# Patient Record
Sex: Female | Born: 1985 | Race: White | Hispanic: No | Marital: Single | Smoking: Current every day smoker
Health system: Southern US, Community
[De-identification: ages and names within clinical notes are randomized; demographics above are authoritative.]

## PROBLEM LIST (undated history)

## (undated) HISTORY — PX: FOOT SURGERY: SHX648

---
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
## 2006-03-23 ENCOUNTER — Emergency Department: Payer: Self-pay | Admitting: Emergency Medicine

## 2007-03-08 ENCOUNTER — Observation Stay: Payer: Self-pay

## 2007-03-10 ENCOUNTER — Inpatient Hospital Stay: Payer: Self-pay | Admitting: Obstetrics and Gynecology

## 2007-05-25 ENCOUNTER — Emergency Department: Payer: Self-pay | Admitting: Emergency Medicine

## 2009-01-29 IMAGING — CR DG FOOT COMPLETE 3+V*L*
1 series · 3 of 3 positions shown · non-contrast
Comparison: none

REASON FOR EXAM: inj   mc-3
COMMENTS:   LMP: Post Partum

PROCEDURE:     DXR - DXR FOOT LT COMP W/OBLIQUES  - May 25, 2007  [DATE]
RESULT:     There is a minimally nondisplaced transverse fracture of the
proximal portion of the proximal phalanx of the 5th toe. No other fractures
are seen.

[Series 1: view not recorded · 0.17mm/px · 3 of 3 slices shown]
[im 1/3]
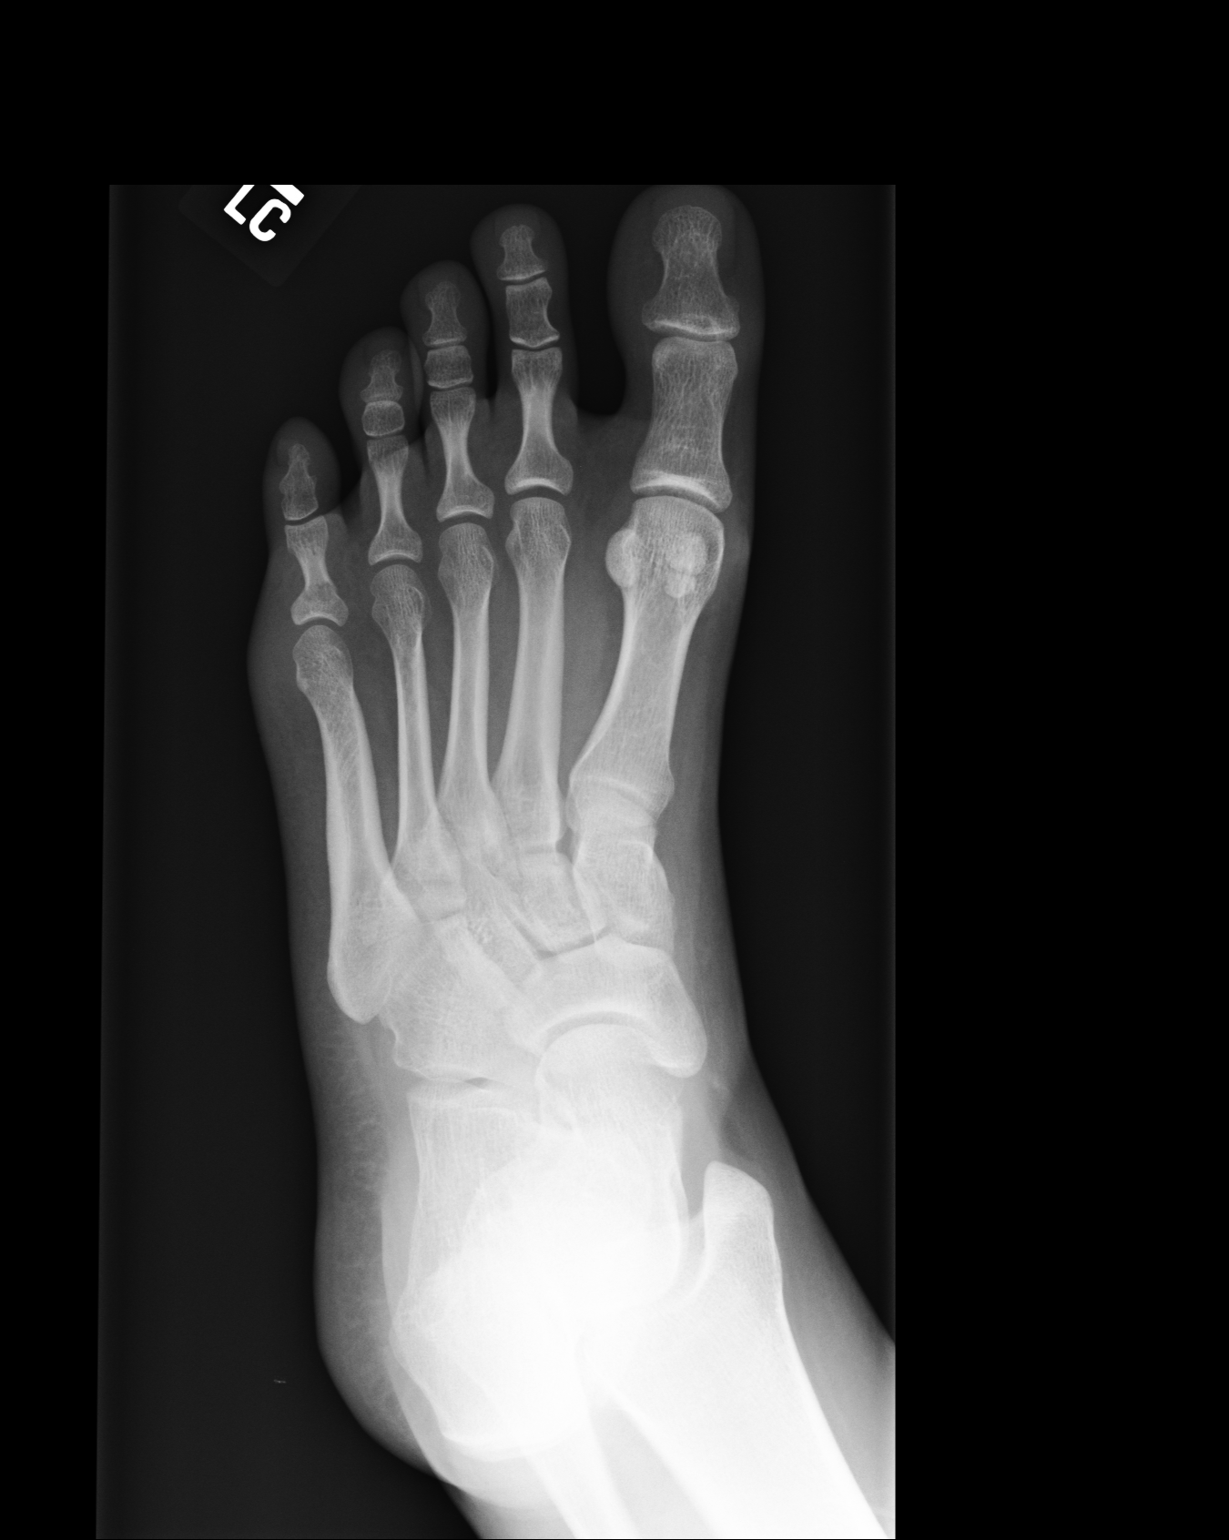
[im 2/3]
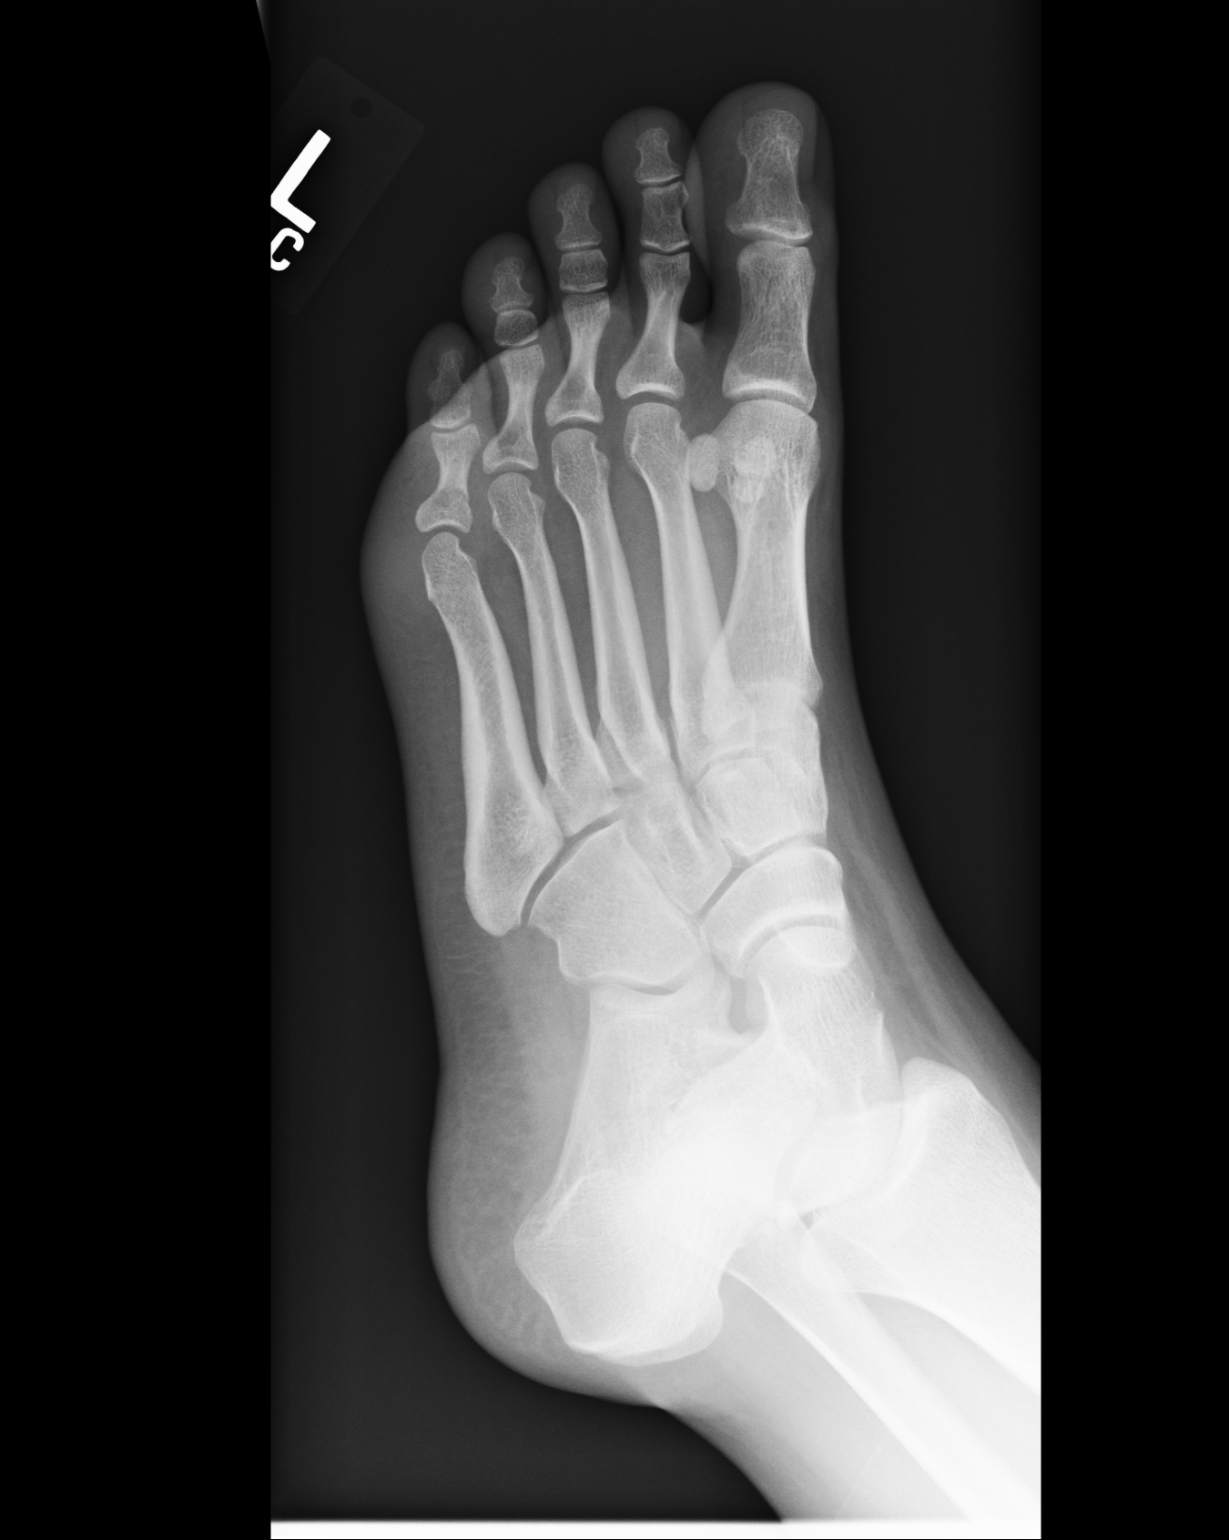
[im 3/3]
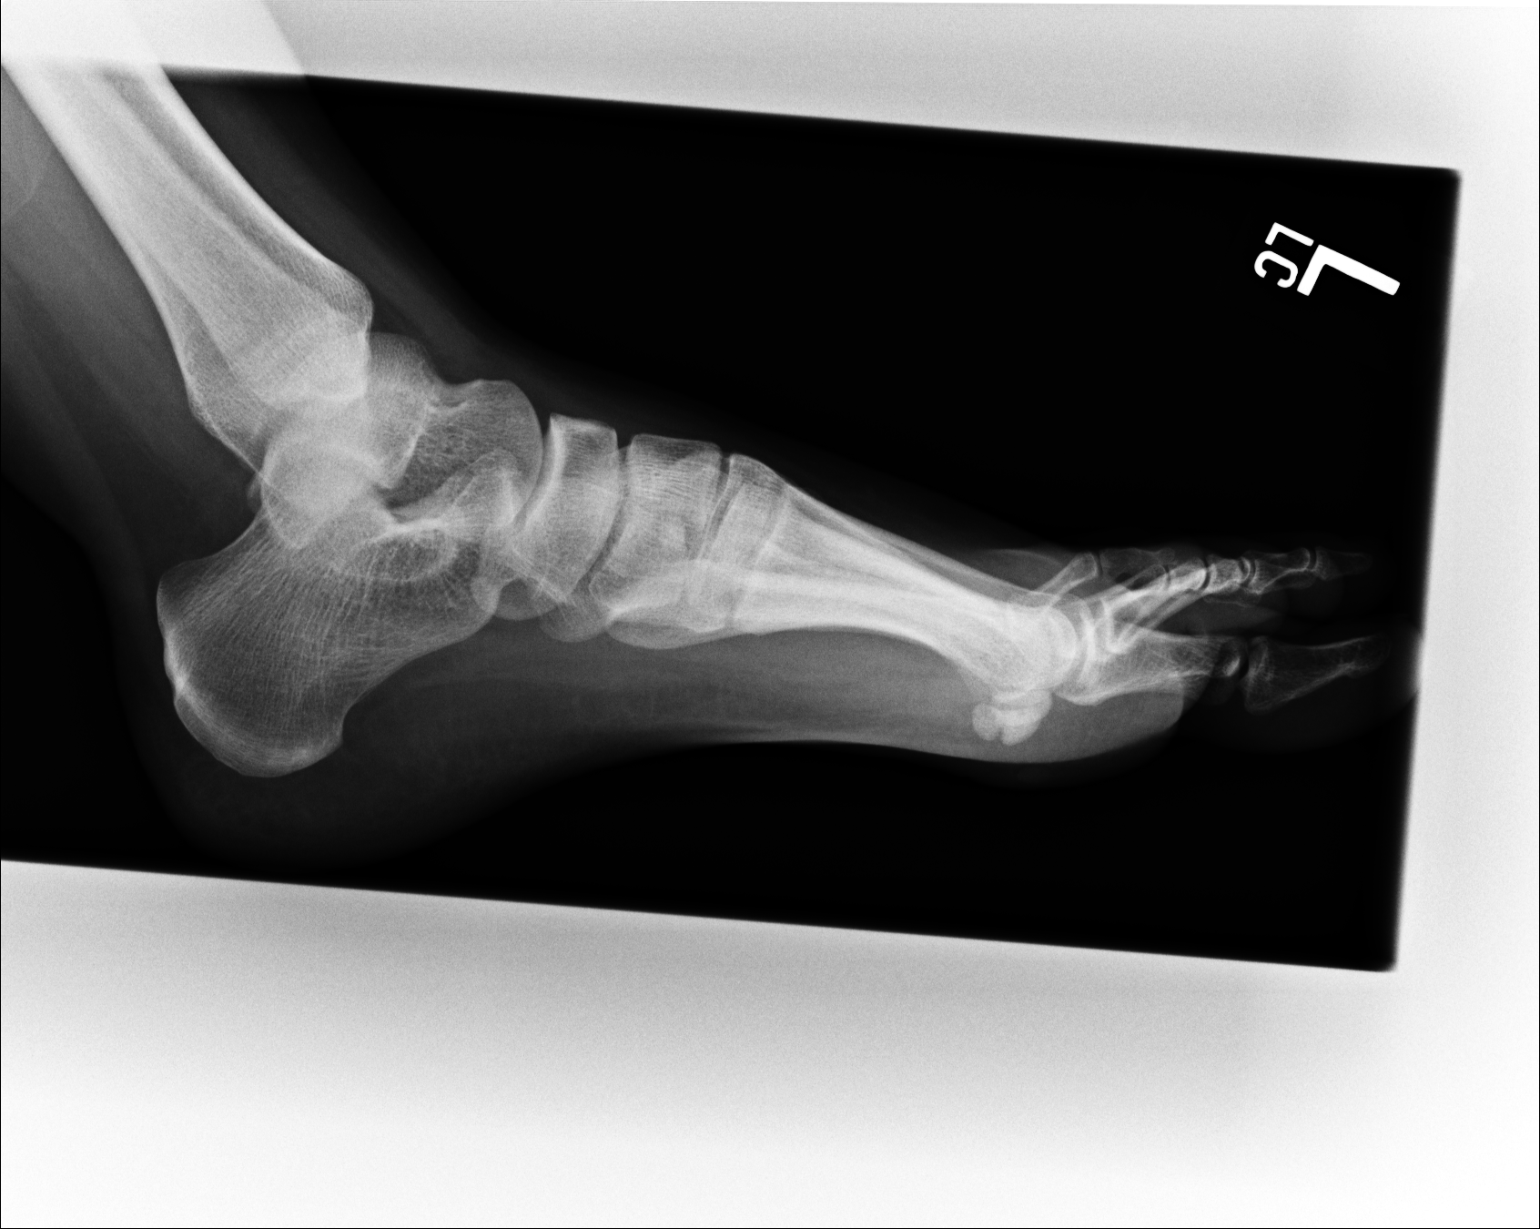

[3 of 3 positions shown; findings below may reference images not displayed]

IMPRESSION: Fracture of the proximal phalanx of the fifth toe.

## 2012-07-28 ENCOUNTER — Emergency Department: Payer: Self-pay | Admitting: Emergency Medicine

## 2013-07-08 ENCOUNTER — Ambulatory Visit: Payer: Self-pay

## 2013-07-08 LAB — RAPID STREP-A WITH REFLX: Micro Text Report: POSITIVE

## 2013-12-17 ENCOUNTER — Emergency Department: Payer: Self-pay | Admitting: Emergency Medicine

## 2013-12-17 LAB — URINALYSIS, COMPLETE
BACTERIA: NONE SEEN
Bilirubin,UR: NEGATIVE
GLUCOSE, UR: NEGATIVE mg/dL (ref 0–75)
Ketone: NEGATIVE
Leukocyte Esterase: NEGATIVE
Nitrite: NEGATIVE
PROTEIN: NEGATIVE
Ph: 6 (ref 4.5–8.0)
SPECIFIC GRAVITY: 1.023 (ref 1.003–1.030)
Squamous Epithelial: <1
WBC UR: <1 /HPF (ref 0–5)

## 2016-06-19 ENCOUNTER — Encounter: Payer: Self-pay | Admitting: Emergency Medicine

## 2016-06-19 DIAGNOSIS — L0501 Pilonidal cyst with abscess: Secondary | ICD-10-CM | POA: Insufficient documentation

## 2016-06-19 DIAGNOSIS — F17210 Nicotine dependence, cigarettes, uncomplicated: Secondary | ICD-10-CM | POA: Diagnosis not present

## 2016-06-19 NOTE — ED Triage Notes (Signed)
Patient ambulatory to triage with steady gait, without difficulty or distress noted; pt reports since yesterday has noticed abscess to coccyx; denies hx of same

## 2016-06-20 ENCOUNTER — Emergency Department
Admission: EM | Admit: 2016-06-20 | Discharge: 2016-06-20 | Disposition: A | Discharge status: Home / Self Care | Payer: Medicaid Other | Attending: Emergency Medicine | Admitting: Emergency Medicine

## 2016-06-20 DIAGNOSIS — L0501 Pilonidal cyst with abscess: Secondary | ICD-10-CM

## 2016-06-20 MED ORDER — BACITRACIN ZINC 500 UNIT/GM EX OINT
TOPICAL_OINTMENT | CUTANEOUS | Status: AC
  Filled 2016-06-20: qty 0.9

## 2016-06-20 MED ORDER — SULFAMETHOXAZOLE-TRIMETHOPRIM 800-160 MG PO TABS
ORAL_TABLET | ORAL | Status: AC
  Filled 2016-06-20: qty 1

## 2016-06-20 MED ORDER — OXYCODONE-ACETAMINOPHEN 5-325 MG PO TABS
ORAL_TABLET | ORAL | Status: AC
  Filled 2016-06-20: qty 1

## 2016-06-20 MED ORDER — LIDOCAINE HCL (PF) 1 % IJ SOLN
INTRAMUSCULAR | Status: AC
  Filled 2016-06-20: qty 5

## 2016-06-20 MED ORDER — OXYCODONE-ACETAMINOPHEN 5-325 MG PO TABS: 1.0000 | ORAL_TABLET | ORAL | 0 refills | Status: DC | PRN

## 2016-06-20 MED ORDER — SULFAMETHOXAZOLE-TRIMETHOPRIM 800-160 MG PO TABS: 1.0000 | ORAL_TABLET | Freq: Two times a day (BID) | ORAL | 0 refills | Status: AC

## 2016-06-20 NOTE — ED Provider Notes (Signed)
[facility] Emergency Department Provider Note    First MD Initiated Contact with Patient 06/20/16 0124     (approximate)  I have reviewed the triage vital signs and the nursing notes.   HISTORY  Chief Complaint Abscess    HPI Sara Middleton is a 31 y.o. female presents with a one-day history of abscess superior to her buttocks. Patient states current pain score is 10 out of 10 located at the abscess site. Patient denies any fever afebrile on presentation. Patient denies any drainage from the abscess site. Patient denies any previous history of abscess   Past medical history None There are no active problems to display for this patient.   Past Surgical History:  Procedure Laterality Date  . FOOT SURGERY Right     Prior to Admission medications   Not on File    Allergies Codeine  Family history Noncontributory   Social History Social History  Substance Use Topics  . Smoking status: Current Every Day Smoker    Packs/day: 0.50    Types: Cigarettes  . Smokeless tobacco: Never Used  . Alcohol use Not on file    Review of Systems Constitutional: No fever/chills Eyes: No visual changes. ENT: No sore throat. Cardiovascular: Denies chest pain. Respiratory: Denies shortness of breath. Gastrointestinal: No abdominal pain.  No nausea, no vomiting.  No diarrhea.  No constipation. Genitourinary: Negative for dysuria. Musculoskeletal: Negative for back pain. Integumentary: Positive for buttock abscess Neurological: Negative for headaches, focal weakness or numbness.   ____________________________________________   PHYSICAL EXAM:  VITAL SIGNS: ED Triage Vitals [06/19/16 2236]  Enc Vitals Group     BP 133/70     Pulse Rate (!) 119     Resp 18     Temp 99.6 F (37.6 C)     Temp Source Oral     SpO2 97 %     Weight 214 lb (97.1 kg)     Height 5\' 5"  (1.651 m)     Head Circumference      Peak Flow      Pain Score 10     Pain  Loc      Pain Edu?      Excl. in GC?     Constitutional: Alert and oriented. Well appearing and in no acute distress. Eyes: Conjunctivae are normal. PERRL. EOMI. Head: Atraumatic. Mouth/Throat: Mucous membranes are moist. Neck: No stridor.  Cardiovascular: Normal rate, regular rhythm. Good peripheral circulation. Grossly normal heart sounds. Respiratory: Normal respiratory effort.  No retractions. Lungs CTAB. Gastrointestinal: Soft and nontender. No distention.  Musculoskeletal: No lower extremity tenderness nor edema. No gross deformities of extremities. Neurologic:  Normal speech and language. No gross focal neurologic deficits are appreciated.  Skin:  Pustule noted with surrounding erythema superior to the median gluteal fall Psychiatric: Mood and affect are normal. Speech and behavior are normal.    ..Incision and Drainage Date/Time: 06/20/2016 5:33 AM Performed by: Darci CurrentBROWN, [place] N Authorized by: Darci CurrentBROWN, [place] N   Consent:    Consent obtained:  Verbal   Consent given by:  Patient   Risks discussed:  Bleeding, incomplete drainage and pain   Alternatives discussed:  No treatment Location:    Type:  Abscess   Location:  Anogenital   Anogenital location:  Pilonidal Pre-procedure details:    Skin preparation:  Betadine Anesthesia (see MAR for exact dosages):    Anesthesia method:  Local infiltration   Local anesthetic:  Lidocaine 1% w/o epi Procedure type:  Complexity:  Simple Procedure details:    Needle aspiration: no     Incision types:  Cruciate   Incision depth:  Dermal   Scalpel blade:  11   Drainage:  Bloody and purulent   Drainage amount:  Moderate   Wound treatment:  Wound left open   Packing materials:  None Post-procedure details:    Patient tolerance of procedure:  Tolerated well, no immediate complications     ____________________________________________   INITIAL IMPRESSION / ASSESSMENT AND PLAN / ED COURSE  Pertinent labs & imaging  results that were available during my care of the patient were reviewed by me and considered in my medical decision making (see chart for details).  Patient given Bactrim DS and Percocet emergency department will be prescribed the same for home's. Spoke with the patient at length regarding warning signs that would warm return to the emergency department including worsening pain erythema and fever.      ____________________________________________  FINAL CLINICAL IMPRESSION(S) / ED DIAGNOSES  Final diagnoses:  Pilonidal abscess     MEDICATIONS GIVEN DURING THIS VISIT:  Medications  lidocaine (PF) (XYLOCAINE) 1 % injection (not administered)  sulfamethoxazole-trimethoprim (BACTRIM DS,SEPTRA DS) 800-160 MG per tablet (not administered)  oxyCODONE-acetaminophen (PERCOCET/ROXICET) 5-325 MG per tablet (not administered)     NEW OUTPATIENT MEDICATIONS STARTED DURING THIS VISIT:  New Prescriptions   No medications on file    Modified Medications   No medications on file    Discontinued Medications   No medications on file     Note:  This document was prepared using Dragon voice recognition software and may include unintentional dictation errors.    Darci Current, MD 06/20/16 [phone]

## 2016-06-20 NOTE — ED Notes (Signed)
MD Manson PasseyBrown and this RN at bedside.

## 2016-06-20 NOTE — ED Notes (Signed)

## 2017-08-04 ENCOUNTER — Emergency Department: Payer: Self-pay

## 2017-08-04 ENCOUNTER — Emergency Department
Admission: EM | Admit: 2017-08-04 | Discharge: 2017-08-04 | Disposition: A | Discharge status: Home / Self Care | Payer: Self-pay | Attending: Student in an Organized Health Care Education/Training Program | Admitting: Student in an Organized Health Care Education/Training Program

## 2017-08-04 ENCOUNTER — Encounter: Payer: Self-pay | Admitting: Emergency Medicine

## 2017-08-04 DIAGNOSIS — S92512A Displaced fracture of proximal phalanx of left lesser toe(s), initial encounter for closed fracture: Secondary | ICD-10-CM | POA: Insufficient documentation

## 2017-08-04 DIAGNOSIS — Y929 Unspecified place or not applicable: Secondary | ICD-10-CM | POA: Insufficient documentation

## 2017-08-04 DIAGNOSIS — Y998 Other external cause status: Secondary | ICD-10-CM | POA: Insufficient documentation

## 2017-08-04 DIAGNOSIS — Y9301 Activity, walking, marching and hiking: Secondary | ICD-10-CM | POA: Insufficient documentation

## 2017-08-04 DIAGNOSIS — W01198A Fall on same level from slipping, tripping and stumbling with subsequent striking against other object, initial encounter: Secondary | ICD-10-CM | POA: Insufficient documentation

## 2017-08-04 DIAGNOSIS — F17210 Nicotine dependence, cigarettes, uncomplicated: Secondary | ICD-10-CM | POA: Insufficient documentation

## 2017-08-04 MED ORDER — HYDROCODONE-ACETAMINOPHEN 5-325 MG PO TABS
1.0000 | ORAL_TABLET | Freq: Once | ORAL | Status: AC
  Administered 2017-08-04: 1 via ORAL
  Filled 2017-08-04: qty 1

## 2017-08-04 MED ORDER — LIDOCAINE HCL (PF) 1 % IJ SOLN
INTRAMUSCULAR | Status: AC
  Filled 2017-08-04: qty 5

## 2017-08-04 MED ORDER — MELOXICAM 15 MG PO TABS: 15.0000 mg | ORAL_TABLET | Freq: Every day | ORAL | 1 refills | Status: AC

## 2017-08-04 MED ORDER — HYDROCODONE-ACETAMINOPHEN 5-325 MG PO TABS: 1.0000 | ORAL_TABLET | Freq: Four times a day (QID) | ORAL | 0 refills | Status: AC | PRN

## 2017-08-04 MED ORDER — LIDOCAINE HCL 1 % IJ SOLN
5.0000 mL | Freq: Once | INTRAMUSCULAR | Status: AC
  Administered 2017-08-04: 21:00:00
  Filled 2017-08-04: qty 5

## 2017-08-04 NOTE — ED Provider Notes (Signed)
[facility] Emergency Department Provider Note  ____________________________________________  Time seen: Approximately 9:09 PM  I have reviewed the triage vital signs and the nursing notes.   HISTORY  Chief Complaint Foot Injury    HPI Emeline GinsRebecca L Usman is a 32 y.o. female presents to the emergency department after patient tripped over her son and injured left fifth toe.  Patient reports that left fifth toe has been in an odd position since injury occurred.  Patient denies skin compromise.  No weakness or changes in sensation of the lower extremities.   History reviewed. No pertinent past medical history.  There are no active problems to display for this patient.   Past Surgical History:  Procedure Laterality Date  . FOOT SURGERY Right     Prior to Admission medications   Medication Sig Start Date End Date Taking? Authorizing Provider  HYDROcodone-acetaminophen (NORCO) 5-325 MG tablet Take 1 tablet by mouth every 6 (six) hours as needed for up to 3 days for moderate pain. 08/04/17 08/07/17  Orvil FeilWoods, Nobuo Nunziata M, PA-C  meloxicam (MOBIC) 15 MG tablet Take 1 tablet (15 mg total) by mouth daily for 7 days. 08/04/17 08/11/17  Orvil FeilWoods, Lael Pilch M, PA-C  oxyCODONE-acetaminophen (ROXICET) 5-325 MG tablet Take 1 tablet by mouth every 4 (four) hours as needed for severe pain. 06/20/16   Darci CurrentBrown, [place] N, MD    Allergies Codeine  No family history on file.  Social History Social History   Tobacco Use  . Smoking status: Current Every Day Smoker    Packs/day: 0.50    Types: Cigarettes  . Smokeless tobacco: Never Used  Substance Use Topics  . Alcohol use: Not on file  . Drug use: Not on file     Review of Systems  Constitutional: No fever/chills Eyes: No visual changes. No discharge ENT: No upper respiratory complaints. Cardiovascular: no chest pain. Respiratory: no cough. No SOB. Gastrointestinal: No abdominal pain.  No nausea, no vomiting.  No diarrhea.  No  constipation. Genitourinary: Negative for dysuria. No hematuria Musculoskeletal: Patient has  left fifth toe pain.  Skin: Negative for rash, abrasions, lacerations, ecchymosis. Neurological: Negative for headaches, focal weakness or numbness.   ____________________________________________   PHYSICAL EXAM:  VITAL SIGNS: ED Triage Vitals  Enc Vitals Group     BP 08/04/17 1918 130/66     Pulse Rate 08/04/17 1918 86     Resp 08/04/17 1918 17     Temp 08/04/17 1918 98.4 F (36.9 C)     Temp Source 08/04/17 1918 Oral     SpO2 08/04/17 1918 99 %     Weight 08/04/17 1842 250 lb (113.4 kg)     Height 08/04/17 1842 5\' 5"  (1.651 m)     Head Circumference --      Peak Flow --      Pain Score 08/04/17 1842 6     Pain Loc --      Pain Edu? --      Excl. in GC? --      Constitutional: Alert and oriented. Well appearing and in no acute distress. Eyes: Conjunctivae are normal. PERRL. EOMI. Head: Atraumatic. Cardiovascular: Normal rate, regular rhythm. Normal S1 and S2.  Good peripheral circulation. Respiratory: Normal respiratory effort without tachypnea or retractions. Lungs CTAB. Good air entry to the bases with no decreased or absent breath sounds. Gastrointestinal: Bowel sounds 4 quadrants. Soft and nontender to palpation. No guarding or rigidity. No palpable masses. No distention. No CVA tenderness. Musculoskeletal: Full range of  motion to all extremities.  Palpable dorsalis pedis pulse, left. Neurologic:  Normal speech and language. No gross focal neurologic deficits are appreciated.  Skin:  Skin is warm, dry and intact. No rash noted. Psychiatric: Mood and affect are normal. Speech and behavior are normal. Patient exhibits appropriate insight and judgement.   ____________________________________________   LABS (all labs ordered are listed, but only abnormal results are displayed)  Labs Reviewed - No data to  display ____________________________________________  EKG   ____________________________________________  RADIOLOGY I personally viewed and evaluated these images as part of my medical decision making, as well as reviewing the written report by the radiologist.  Dg Foot Complete Left  Result Date: 08/04/2017 CLINICAL DATA:  Left little toe injury. EXAM: LEFT FOOT - COMPLETE 3+ VIEW COMPARISON:  None. FINDINGS: There is a laterally angulated fracture of the proximal shaft of the left fifth proximal phalanx. No other fracture. IMPRESSION: Laterally angulated fracture of the proximal phalanx of the left fifth toe. Electronically Signed   By: Deatra Robinson M.D.   On: 08/04/2017 19:15    ____________________________________________    PROCEDURES  Procedure(s) performed:    Procedures   Patient's left fifth toe was anesthetized using lidocaine 1% without epinephrine.  Patient's toe was reduced using traction and medial deviation.  Medications  lidocaine (XYLOCAINE) 1 % (with pres) injection 5 mL (has no administration in time range)  HYDROcodone-acetaminophen (NORCO/VICODIN) 5-325 MG per tablet 1 tablet (1 tablet Oral Given 08/04/17 2109)     ____________________________________________   INITIAL IMPRESSION / ASSESSMENT AND PLAN / ED COURSE  Pertinent labs & imaging results that were available during my care of the patient were reviewed by me and considered in my medical decision making (see chart for details).  Review of the [place] CSRS was performed in accordance of the [facility] prior to dispensing any controlled drugs.      Assessment and plan Left fifth toe fracture Patient presents to the emergency department with left fifth toe pain after tripping over her son.  Patient's left fifth toe was reduced in the emergency department and buddy taped.  A postop shoe was given and patient was discharged with meloxicam and Norco.  Patient was advised to follow-up with podiatry.  Vital  signs are reassuring prior to discharge.     ____________________________________________  FINAL CLINICAL IMPRESSION(S) / ED DIAGNOSES  Final diagnoses:  Closed displaced fracture of proximal phalanx of lesser toe of left foot, initial encounter      NEW MEDICATIONS STARTED DURING THIS VISIT:  ED Discharge Orders        Ordered    HYDROcodone-acetaminophen (NORCO) 5-325 MG tablet  Every 6 hours PRN     08/04/17 2058    meloxicam (MOBIC) 15 MG tablet  Daily     08/04/17 2058          This chart was dictated using voice recognition software/Dragon. Despite best efforts to proofread, errors can occur which can change the meaning. Any change was purely unintentional.    Gasper Lloyd 08/04/17 2114    Willy Eddy, MD 08/04/17 2222

## 2017-08-04 NOTE — ED Triage Notes (Signed)
Tripped over son and injured left foot -- fifth toe.

## 2019-04-11 IMAGING — CR DG FOOT COMPLETE 3+V*L*
1 series · 3 of 3 positions shown · non-contrast
Comparison: None.

CLINICAL DATA: Left little toe injury.

EXAM:
LEFT FOOT - COMPLETE 3+ VIEW

[Series 1: x foot ap left · 0.14mm/px · 3 of 3 slices shown]
[im 1/3]
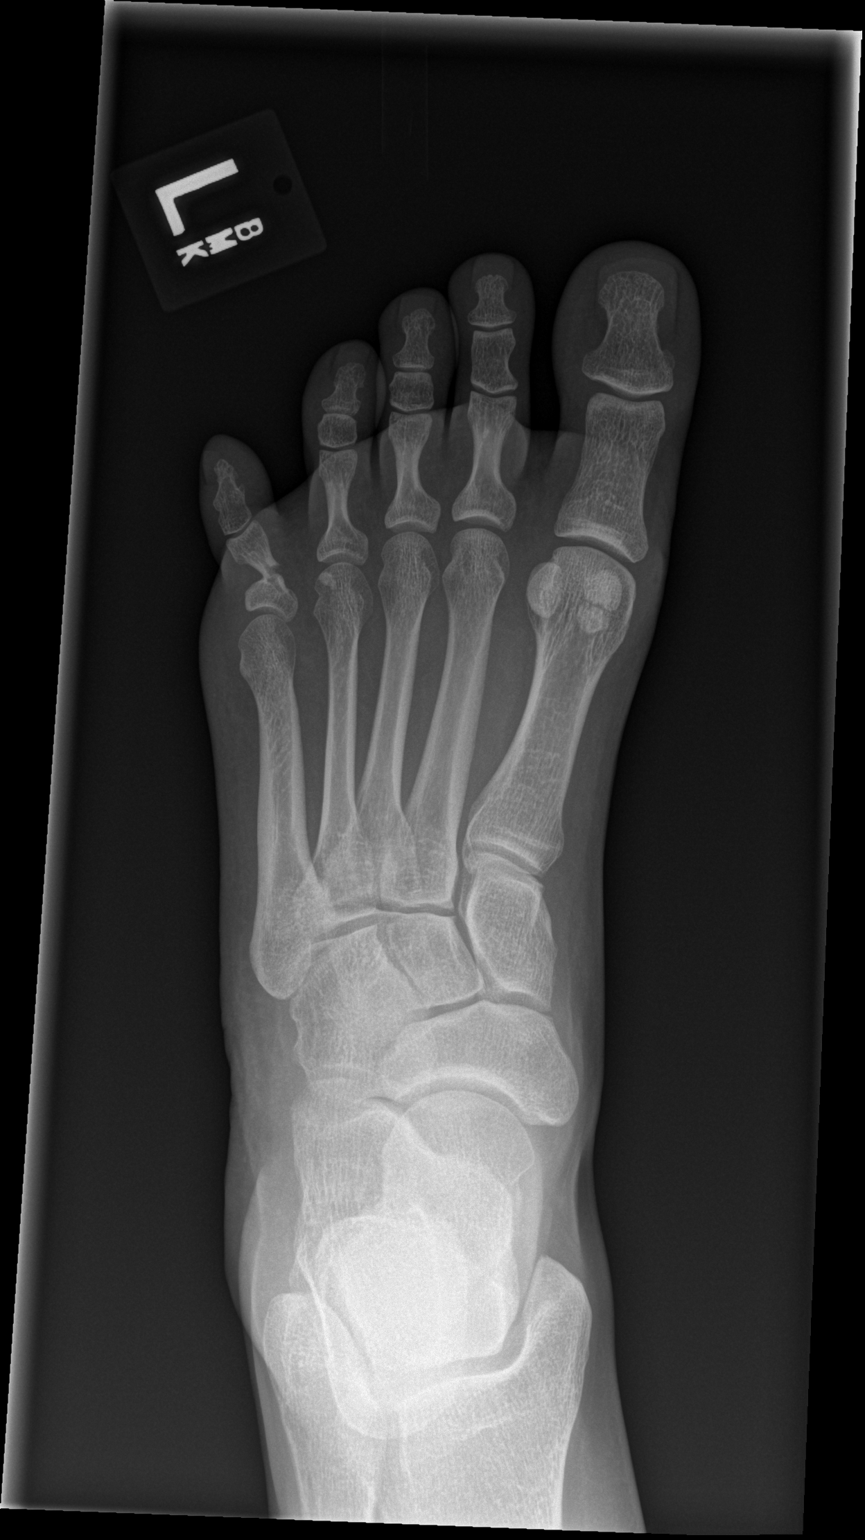
[im 2/3]
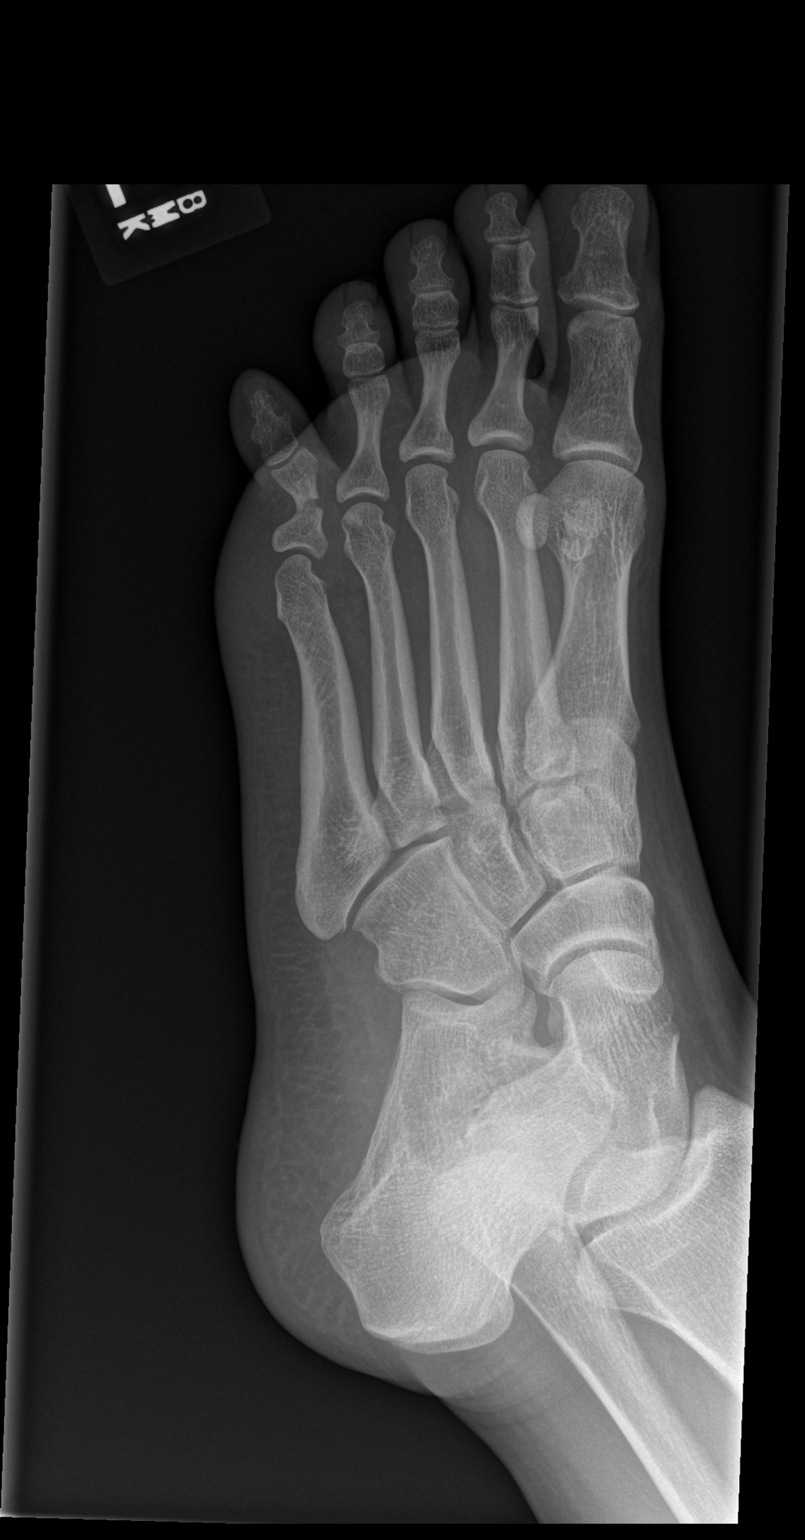
[im 3/3]
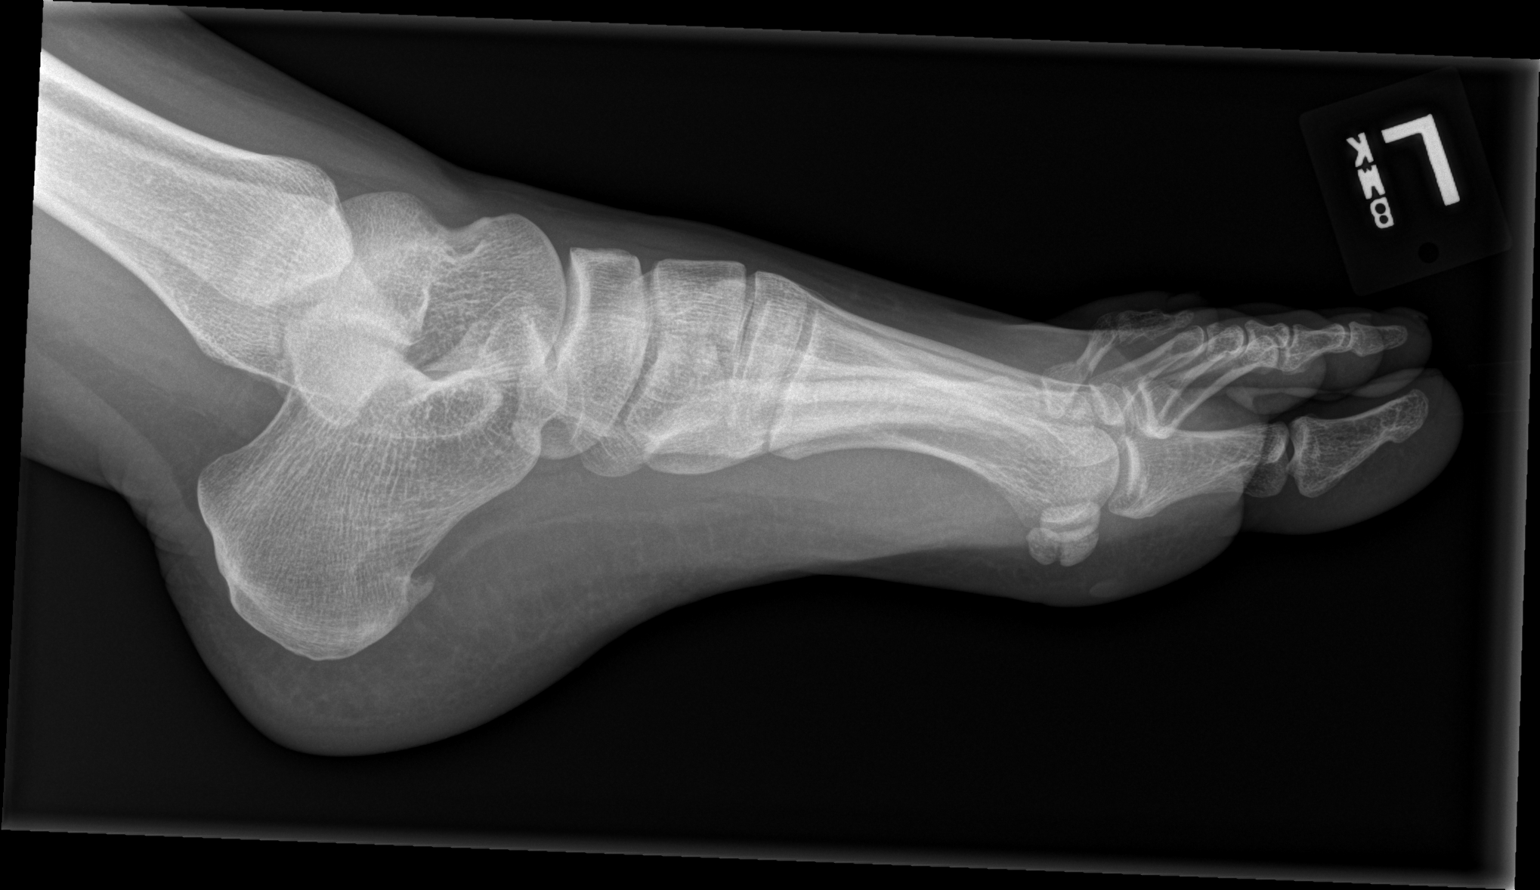

[3 of 3 positions shown; findings below may reference images not displayed]

FINDINGS: There is a laterally angulated fracture of the proximal shaft of the
left fifth proximal phalanx. No other fracture.
IMPRESSION: Laterally angulated fracture of the proximal phalanx of the left
fifth toe.

## 2019-11-16 ENCOUNTER — Encounter: Payer: Self-pay | Admitting: Emergency Medicine

## 2019-11-16 ENCOUNTER — Other Ambulatory Visit: Payer: Self-pay

## 2019-11-16 ENCOUNTER — Emergency Department
Admission: EM | Admit: 2019-11-16 | Discharge: 2019-11-16 | Disposition: A | Discharge status: Home / Self Care | Payer: Self-pay | Attending: Emergency Medicine | Admitting: Emergency Medicine

## 2019-11-16 DIAGNOSIS — F17210 Nicotine dependence, cigarettes, uncomplicated: Secondary | ICD-10-CM | POA: Insufficient documentation

## 2019-11-16 DIAGNOSIS — L03317 Cellulitis of buttock: Secondary | ICD-10-CM | POA: Insufficient documentation

## 2019-11-16 MED ORDER — CEFTRIAXONE SODIUM 1 G IJ SOLR
1.0000 g | Freq: Once | INTRAMUSCULAR | Status: AC
  Administered 2019-11-16: 1 g via INTRAMUSCULAR
  Filled 2019-11-16: qty 10

## 2019-11-16 MED ORDER — SULFAMETHOXAZOLE-TRIMETHOPRIM 800-160 MG PO TABS: 1.0000 | ORAL_TABLET | Freq: Two times a day (BID) | ORAL | 0 refills | Status: AC

## 2019-11-16 MED ORDER — CEPHALEXIN 500 MG PO CAPS: 500.0000 mg | ORAL_CAPSULE | Freq: Three times a day (TID) | ORAL | 0 refills | Status: AC

## 2019-11-16 MED ORDER — TRAMADOL HCL 50 MG PO TABS: 50.0000 mg | ORAL_TABLET | Freq: Four times a day (QID) | ORAL | 0 refills | Status: AC | PRN

## 2019-11-16 NOTE — ED Notes (Signed)
See triage note. Pt ambulatory to room. Pt stating she has had a cyst on her tailbone x3-4days. Pt stating having a cyst drained prior.

## 2019-11-16 NOTE — ED Provider Notes (Signed)
Emergency Department Provider Note  ____________________________________________  Time seen: Approximately 5:36 PM  I have reviewed the triage vital signs and the nursing notes.   HISTORY  Chief Complaint Cyst   Historian Patient     HPI Sara Middleton is a 34 y.o. female presents to the emergency department with concern for possible abscess.  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  She states that she currently has redness along the superior aspect of each buttocks.  She denies fever and chills at home.  She states that pain is worse with prolonged sitting.  No other alleviating measures have been attempted.   History reviewed. No pertinent past medical history.   Immunizations up to date:  Yes.     History reviewed. No pertinent past medical history.  There are no problems to display for this patient.   Past Surgical History:  Procedure Laterality Date  . FOOT SURGERY Right     Prior to Admission medications   Medication Sig Start Date End Date Taking? Authorizing Provider  cephALEXin (KEFLEX) 500 MG capsule Take 1 capsule (500 mg total) by mouth 3 (three) times daily for 7 days. 11/16/19 11/23/19  Orvil Feil, PA-C  oxyCODONE-acetaminophen (ROXICET) 5-325 MG tablet Take 1 tablet by mouth every 4 (four) hours as needed for severe pain. 06/20/16   Darci Current, MD  sulfamethoxazole-trimethoprim (BACTRIM DS) 800-160 MG tablet Take 1 tablet by mouth 2 (two) times daily for 7 days. 11/16/19 11/23/19  Orvil Feil, PA-C  traMADol (ULTRAM) 50 MG tablet Take 1 tablet (50 mg total) by mouth every 6 (six) hours as needed for up to 3 days. 11/16/19 11/19/19  Orvil Feil, PA-C    Allergies Codeine  No family history on file.  Social History Social History   Tobacco Use  . Smoking status: Current Every Day Smoker    Packs/day: 0.50    Types: Cigarettes  . Smokeless tobacco: Never Used  Substance Use Topics  . Alcohol use: Not on  file  . Drug use: Not on file     Review of Systems  Constitutional: No fever/chills Eyes:  No discharge ENT: No upper respiratory complaints. Respiratory: no cough. No SOB/ use of accessory muscles to breath Gastrointestinal:   No nausea, no vomiting.  No diarrhea.  No constipation. Musculoskeletal: Negative for musculoskeletal pain. Skin: Patient has cellulitis of buttocks   ____________________________________________   PHYSICAL EXAM:  VITAL SIGNS: ED Triage Vitals  Enc Vitals Group     BP 11/16/19 1615 126/78     Pulse Rate 11/16/19 1615 (!) 104     Resp 11/16/19 1615 18     Temp 11/16/19 1615 98.5 F (36.9 C)     Temp Source 11/16/19 1615 Oral     SpO2 11/16/19 1615 99 %     Weight 11/16/19 1618 215 lb (97.5 kg)     Height 11/16/19 1618 5\' 4"  (1.626 m)     Head Circumference --      Peak Flow --      Pain Score 11/16/19 1618 7     Pain Loc --      Pain Edu? --      Excl. in GC? --      Constitutional: Alert and oriented. Well appearing and in no acute distress. Eyes: Conjunctivae are normal. PERRL. EOMI. Head: Atraumatic. Cardiovascular: Normal rate, regular rhythm. Normal S1 and S2.  Good peripheral circulation. Respiratory: Normal respiratory effort without tachypnea or retractions.  Lungs CTAB. Good air entry to the bases with no decreased or absent breath sounds Gastrointestinal: Bowel sounds x 4 quadrants. Soft and nontender to palpation. No guarding or rigidity. No distention. Musculoskeletal: Full range of motion to all extremities. No obvious deformities noted Neurologic:  Normal for age. No gross focal neurologic deficits are appreciated.  Skin: Patient has a 2 cm x 2 cm region of erythema along the superior aspect of each buttocks.  Patient has 2 distinct regions of erythema that are indurated without fluctuance.  No erythema or induration or fluctuance at the superior aspect of the intergluteal crease where pilonidal cyst would be. Psychiatric: Mood  and affect are normal for age. Speech and behavior are normal.   ____________________________________________   LABS (all labs ordered are listed, but only abnormal results are displayed)  Labs Reviewed - No data to display ____________________________________________  EKG   ____________________________________________  RADIOLOGY  No results found.  ____________________________________________    PROCEDURES  Procedure(s) performed:     Procedures     Medications  cefTRIAXone (ROCEPHIN) injection 1 g (1 g Intramuscular Given 11/16/19 1756)     ____________________________________________   INITIAL IMPRESSION / ASSESSMENT AND PLAN / ED COURSE  Pertinent labs & imaging results that were available during my care of the patient were reviewed by me and considered in my medical decision making (see chart for details).      Assessment and Plan:  Gluteal cellulitis. 34 year old female presents to the emergency department with erythema along the superior aspect of the buttocks.  Patient was mildly tachycardic at triage but vital signs were otherwise reassuring.  She had no palpable fluctuance or induration at the superior aspect of the intergluteal crease.  I offered to conduct incision and drainage in the emergency department and patient stated that she would prefer to try a trial of p.o. antibiotics before undergoing procedure.  I cautioned patient that if she has a collection of purulence conducive to drainage that she would not improve symptomatically.  She voiced understanding.  Patient was given an injection of Rocephin in the emergency department prior to discharge.  She was discharged with Bactrim and Keflex.  Return precautions were given to return with worsening symptoms.    ____________________________________________  FINAL CLINICAL IMPRESSION(S) / ED DIAGNOSES  Final diagnoses:  Cellulitis, gluteal      NEW MEDICATIONS STARTED DURING THIS  VISIT:  ED Discharge Orders         Ordered    sulfamethoxazole-trimethoprim (BACTRIM DS) 800-160 MG tablet  2 times daily        11/16/19 1805    cephALEXin (KEFLEX) 500 MG capsule  3 times daily        11/16/19 1806    traMADol (ULTRAM) 50 MG tablet  Every 6 hours PRN        11/16/19 1806              This chart was dictated using voice recognition software/Dragon. Despite best efforts to proofread, errors can occur which can change the meaning. Any change was purely unintentional.     Orvil Feil, PA-C 11/16/19 Dereck Ligas, MD 11/17/19 [phone]

## 2019-11-16 NOTE — Discharge Instructions (Addendum)
Take Bactrim twice daily for the next seven days. Take Keflex three times daily for the next seven days. Return if symptoms worsen.

## 2019-11-16 NOTE — ED Triage Notes (Signed)
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  Pt states cyst has been present this time for 3-4 days, trying to take hot baths and use warm compress but no relief. States she has missed 2 days of work because of the pain.

## 2021-08-13 ENCOUNTER — Other Ambulatory Visit: Payer: Self-pay

## 2021-08-13 ENCOUNTER — Encounter (HOSPITAL_COMMUNITY): Payer: Self-pay | Admitting: *Deleted

## 2021-08-13 ENCOUNTER — Emergency Department (HOSPITAL_COMMUNITY)
Admission: EM | Admit: 2021-08-13 | Discharge: 2021-08-13 | Disposition: A | Discharge status: Home / Self Care | Payer: Self-pay | Attending: Emergency Medicine | Admitting: Emergency Medicine

## 2021-08-13 DIAGNOSIS — K029 Dental caries, unspecified: Secondary | ICD-10-CM | POA: Insufficient documentation

## 2021-08-13 DIAGNOSIS — K0889 Other specified disorders of teeth and supporting structures: Secondary | ICD-10-CM

## 2021-08-13 MED ORDER — MELOXICAM 7.5 MG PO TABS: 7.5000 mg | ORAL_TABLET | Freq: Every day | ORAL | 0 refills | Status: DC

## 2021-08-13 MED ORDER — AMOXICILLIN-POT CLAVULANATE 875-125 MG PO TABS: 1.0000 | ORAL_TABLET | Freq: Two times a day (BID) | ORAL | 0 refills | Status: DC

## 2021-08-13 NOTE — ED Provider Notes (Signed)
[facility] EMERGENCY DEPARTMENT Provider Note   CSN: [id number] Arrival date & time: 08/13/21  1534     History  Chief Complaint  Patient presents with   Dental Pain    Sara Middleton is a 36 y.o. female.  Patient with no pertinent past medical history presents today with complaints of dental pain.  She states that 2 days ago her left upper molar cracked and she has had worsening pain since then.  States that she has had difficulty getting her pain under control with over-the-counter ibuprofen and Tylenol.  States that she has been unable to schedule an appointment with a dentist given that it is a holiday weekend and she has had some difficulty as she just started a new job and has not gotten her insurance cards yet.  She denies any fevers, chills, facial swelling, or difficulty swallowing.  The history is provided by the patient. No language interpreter was used.  Dental Pain      Home Medications Prior to Admission medications   Medication Sig Start Date End Date Taking? Authorizing Provider  oxyCODONE-acetaminophen (ROXICET) 5-325 MG tablet Take 1 tablet by mouth every 4 (four) hours as needed for severe pain. 06/20/16   Darci Current, MD      Allergies    Codeine    Review of Systems   Review of Systems  HENT:  Positive for dental problem.   All other systems reviewed and are negative.   Physical Exam Updated Vital Signs BP (!) 128/98 (BP Location: Right Arm)   Pulse 65   Temp 98.1 F (36.7 C) (Oral)   Resp 16   Ht 5\' 5"  (1.651 m)   Wt 100.7 kg   LMP 07/30/2021   SpO2 100%   BMI 36.94 kg/m  Physical Exam Vitals and nursing note reviewed.  Constitutional:      General: She is not in acute distress.    Appearance: Normal appearance. She is normal weight. She is not ill-appearing, toxic-appearing or diaphoretic.  HENT:     Head: Normocephalic and atraumatic.     Mouth/Throat:     Mouth: Mucous membranes are moist.     Dentition: No gingival  swelling or dental abscesses.     Tongue: No lesions. Tongue does not deviate from midline.     Palate: No mass and lesions.     Pharynx: Oropharynx is clear. Uvula midline.      Comments: Dentition poor throughout the mouth with multiple dental caries.  Left upper molar tender to palpation with visible crack present.  No facial swelling or obvious abscess.  No swelling beneath the tongue or trismus. Cardiovascular:     Rate and Rhythm: Normal rate.  Pulmonary:     Effort: Pulmonary effort is normal. No respiratory distress.  Musculoskeletal:        General: Normal range of motion.     Cervical back: Normal range of motion.  Skin:    General: Skin is warm and dry.  Neurological:     General: No focal deficit present.     Mental Status: She is alert.  Psychiatric:        Mood and Affect: Mood normal.        Behavior: Behavior normal.     ED Results / Procedures / Treatments   Labs (all labs ordered are listed, but only abnormal results are displayed) Labs Reviewed - No data to display  EKG None  Radiology No results found.  Procedures Procedures  Medications Ordered in ED Medications - No data to display  ED Course/ Medical Decision Making/ A&P                           Medical Decision Making  Patient presents today with left upper dental pain x2 days.   She is afebrile, nontoxic-appearing, and in no acute distress with reassuring vital signs.  Physical exam reveals no gross abscess or facial swelling.  Exam unconcerning for Ludwig's angina or spread of infection.  Will treat with Augmentin and anti-inflammatories medicine.  Patient is understanding and amenable with plan, educated on red flag symptoms of prompt immediate return.  Also educated on the importance of close follow-up with a dentist.  Resources given to help assist with this in her discharge paperwork.  Patient discharged in stable condition.   Final Clinical Impression(s) / ED Diagnoses Final  diagnoses:  Pain, dental    Rx / DC Orders ED Discharge Orders          Ordered    amoxicillin-clavulanate (AUGMENTIN) 875-125 MG tablet  Every 12 hours        08/13/21 2057    meloxicam (MOBIC) 7.5 MG tablet  Daily        08/13/21 2057          An After Visit Summary was printed and given to the patient.     Vear Clock 08/13/21 2059    Linwood Dibbles, MD 08/14/21 [phone]

## 2021-08-13 NOTE — ED Triage Notes (Signed)
Pt with left sided dental pain x 2 days, throbbing to face today.  Pt c/o nausea due to taking ibuprofen.  Chills last night per pt.

## 2021-08-13 NOTE — Discharge Instructions (Signed)
As we discussed, I suspect that your symptoms are related to a dental cavity.  I have given you a prescription for an antibiotic to help with managing this.  I have also given you a prescription for pain medication for you to take as prescribed as needed.  Additionally, I have attached a resource guide to help you find a dentist for further evaluation and management of your symptoms.  It is very important that you are able to follow-up with a dentist soon as possible.  Return if development of any new or worsening symptoms.

## 2021-12-20 ENCOUNTER — Encounter: Payer: Self-pay | Admitting: Emergency Medicine

## 2021-12-20 ENCOUNTER — Other Ambulatory Visit: Payer: Self-pay

## 2021-12-20 ENCOUNTER — Emergency Department
Admission: EM | Admit: 2021-12-20 | Discharge: 2021-12-20 | Disposition: A | Discharge status: Home / Self Care | Payer: Self-pay | Attending: Emergency Medicine | Admitting: Emergency Medicine

## 2021-12-20 ENCOUNTER — Emergency Department: Payer: Self-pay

## 2021-12-20 DIAGNOSIS — M25512 Pain in left shoulder: Secondary | ICD-10-CM | POA: Diagnosis present

## 2021-12-20 DIAGNOSIS — R0789 Other chest pain: Secondary | ICD-10-CM | POA: Insufficient documentation

## 2021-12-20 DIAGNOSIS — Y92410 Unspecified street and highway as the place of occurrence of the external cause: Secondary | ICD-10-CM | POA: Diagnosis not present

## 2021-12-20 MED ORDER — CYCLOBENZAPRINE HCL 10 MG PO TABS: 10.0000 mg | ORAL_TABLET | Freq: Three times a day (TID) | ORAL | 0 refills | Status: AC | PRN

## 2021-12-20 MED ORDER — MELOXICAM 15 MG PO TABS: 15.0000 mg | ORAL_TABLET | Freq: Every day | ORAL | 0 refills | Status: AC

## 2021-12-20 NOTE — Discharge Instructions (Addendum)
-  You may take acetaminophen as needed for pain.  You may additionally take meloxicam.  -You may take the cyclobenzaprine for muscle relaxation, though use caution as it may make you dizzy/drowsy.  -Follow-up with your primary care provider as needed.  -Return to the emergency department anytime if you begin to experience any new or worsening symptoms.
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
## 2022-08-21 ENCOUNTER — Encounter (HOSPITAL_COMMUNITY): Payer: Self-pay

## 2022-08-21 ENCOUNTER — Emergency Department (HOSPITAL_COMMUNITY)
Admission: EM | Admit: 2022-08-21 | Discharge: 2022-08-21 | Disposition: A | Discharge status: Home / Self Care | Payer: 59 | Attending: Emergency Medicine | Admitting: Emergency Medicine

## 2022-08-21 ENCOUNTER — Other Ambulatory Visit: Payer: Self-pay

## 2022-08-21 DIAGNOSIS — N611 Abscess of the breast and nipple: Secondary | ICD-10-CM | POA: Diagnosis not present

## 2022-08-21 MED ORDER — DOXYCYCLINE HYCLATE 100 MG PO TABS
100.0000 mg | ORAL_TABLET | Freq: Once | ORAL | Status: AC
  Administered 2022-08-21: 100 mg via ORAL
  Filled 2022-08-21: qty 1

## 2022-08-21 MED ORDER — DOXYCYCLINE HYCLATE 100 MG PO CAPS: 100.0000 mg | ORAL_CAPSULE | Freq: Two times a day (BID) | ORAL | 0 refills | Status: AC

## 2022-08-21 MED ORDER — IBUPROFEN 400 MG PO TABS
600.0000 mg | ORAL_TABLET | Freq: Once | ORAL | Status: AC
  Administered 2022-08-21: 600 mg via ORAL
  Filled 2022-08-21: qty 2

## 2022-08-21 NOTE — Discharge Instructions (Signed)
You are seen today for an abscess to your breast.  I offered to drain this, since you are declining drainage we are prescribing antibiotics and I would recommend you do warm compresses and follow-up with your PCP and/or the breast clinic.  You can take Tylenol and ibuprofen as needed for discomfort.  It is important to take the antibiotics.  As we discussed without drainage this may not get better with antibiotics alone.

## 2022-08-21 NOTE — ED Provider Notes (Signed)
[facility] EMERGENCY DEPARTMENT AT [facility] Provider Note   CSN: [id number] Arrival date & time: 08/21/22  2131     History  Chief Complaint  Patient presents with   Insect Bite    Sara Middleton is a 37 y.o. female.  Presents for red swollen area to the right breast for the past 2 days, thinks she was bitten by a spider.  She did not see anything bite her, denies any itching or hives or other symptoms.  No history of abscess in the past she states.  No fevers or chills, no nausea or vomiting.  She is not diabetic.  HPI     Home Medications Prior to Admission medications   Medication Sig Start Date End Date Taking? Authorizing Provider  doxycycline (VIBRAMYCIN) 100 MG capsule Take 1 capsule (100 mg total) by mouth 2 (two) times daily for 7 days. 08/21/22 08/28/22 Yes Milam Allbaugh A, PA-C      Allergies    Codeine    Review of Systems   Review of Systems  Physical Exam Updated Vital Signs BP 127/77 (BP Location: Left Arm)   Pulse (!) 105   Temp 98.4 F (36.9 C) (Oral)   Resp 16   SpO2 98%  Physical Exam Vitals and nursing note reviewed.  Constitutional:      General: She is not in acute distress.    Appearance: She is well-developed.  HENT:     Head: Normocephalic and atraumatic.  Eyes:     Conjunctiva/sclera: Conjunctivae normal.  Cardiovascular:     Rate and Rhythm: Normal rate and regular rhythm.     Heart sounds: No murmur heard. Pulmonary:     Effort: Pulmonary effort is normal. No respiratory distress.     Breath sounds: Normal breath sounds.  Chest:     Comments: Approximate 1 cm area of fluctuance with moderate surrounding erythema the medial lower quadrant of the right breast is noted.  No drainage.  No induration or crepitus. Abdominal:     Palpations: Abdomen is soft.     Tenderness: There is no abdominal tenderness.  Musculoskeletal:        General: No swelling. Normal range of motion.     Cervical back: Neck supple.  Skin:     General: Skin is warm and dry.     Capillary Refill: Capillary refill takes less than 2 seconds.  Neurological:     General: No focal deficit present.     Mental Status: She is alert and oriented to person, place, and time.  Psychiatric:        Mood and Affect: Mood normal.     ED Results / Procedures / Treatments   Labs (all labs ordered are listed, but only abnormal results are displayed) Labs Reviewed - No data to display  EKG None  Radiology No results found.  Procedures Procedures    Medications Ordered in ED Medications  ibuprofen (ADVIL) tablet 600 mg (has no administration in time range)  doxycycline (VIBRA-TABS) tablet 100 mg (has no administration in time range)    ED Course/ Medical Decision Making/ A&P                             Medical Decision Making DDx: Abscess, cellulitis, mastitis, epidermal inclusion cyst, other  ED course: Patient presents the ER for right breast abscess.  Think she was bitten by a spider but did not see anything bite her.  She is noted on exam to have a small fluctuant abscess.  Is very superficial.    I recommended incision and drainage in the ED.  She is adamantly refusing drainage here.  We discussed that this may not resolve with antibiotics alone.  We discussed that it can get worse without draining it appropriately now.  She verbalized understanding and would like to try antibiotics and warm compresses.  Having no fever or chills or systemic symptoms.  Discussed she should follow-up with PCP and/or with the breast center and was given strict return precautions.  Amount and/or Complexity of Data Reviewed External Data Reviewed: notes.  Risk Prescription drug management.           Final Clinical Impression(s) / ED Diagnoses Final diagnoses:  Abscess of breast    Rx / DC Orders ED Discharge Orders          Ordered    doxycycline (VIBRAMYCIN) 100 MG capsule  2 times daily        08/21/22 2227               Josem Kaufmann 08/21/22 2232    Vanetta Mulders, MD 08/22/22 [phone]

## 2022-08-21 NOTE — ED Triage Notes (Signed)
Pt presents with possible spider bite x2 days ago. Location is under right breast. Pt states that it is swollen and painful. States she felt dizzy today.

## 2022-08-22 ENCOUNTER — Encounter: Payer: Self-pay | Admitting: Physician Assistant
